# Patient Record
Sex: Female | Born: 1965 | Race: Black or African American | Hispanic: No | Marital: Married | Smoking: Never smoker
Health system: Southern US, Community
[De-identification: ages and names within clinical notes are randomized; demographics above are authoritative.]

## PROBLEM LIST (undated history)

## (undated) DIAGNOSIS — M549 Dorsalgia, unspecified: Secondary | ICD-10-CM

---

## 1997-05-15 ENCOUNTER — Inpatient Hospital Stay (HOSPITAL_COMMUNITY): Admission: AD | Admit: 1997-05-15 | Discharge: 1997-05-15 | Payer: Self-pay | Admitting: Obstetrics

## 1998-01-15 ENCOUNTER — Other Ambulatory Visit: Admission: RE | Admit: 1998-01-15 | Discharge: 1998-01-15 | Payer: Self-pay | Admitting: Obstetrics and Gynecology

## 1998-01-16 ENCOUNTER — Ambulatory Visit (HOSPITAL_COMMUNITY): Admission: RE | Admit: 1998-01-16 | Discharge: 1998-01-16 | Payer: Self-pay | Admitting: Obstetrics and Gynecology

## 1999-02-03 ENCOUNTER — Inpatient Hospital Stay (HOSPITAL_COMMUNITY): Admission: AD | Admit: 1999-02-03 | Discharge: 1999-02-03 | Payer: Self-pay | Admitting: Obstetrics and Gynecology

## 1999-04-25 ENCOUNTER — Inpatient Hospital Stay (HOSPITAL_COMMUNITY): Admission: AD | Admit: 1999-04-25 | Discharge: 1999-04-28 | Payer: Self-pay | Admitting: Obstetrics & Gynecology

## 1999-04-25 ENCOUNTER — Inpatient Hospital Stay (HOSPITAL_COMMUNITY): Admission: AD | Admit: 1999-04-25 | Discharge: 1999-04-25 | Payer: Self-pay | Admitting: Family Medicine

## 1999-05-29 ENCOUNTER — Other Ambulatory Visit: Admission: RE | Admit: 1999-05-29 | Discharge: 1999-05-29 | Payer: Self-pay | Admitting: Obstetrics & Gynecology

## 2006-01-28 DIAGNOSIS — L290 Pruritus ani: Secondary | ICD-10-CM

## 2006-01-28 DIAGNOSIS — J309 Allergic rhinitis, unspecified: Secondary | ICD-10-CM | POA: Insufficient documentation

## 2006-01-28 DIAGNOSIS — E669 Obesity, unspecified: Secondary | ICD-10-CM

## 2006-01-28 DIAGNOSIS — I10 Essential (primary) hypertension: Secondary | ICD-10-CM | POA: Insufficient documentation

## 2006-09-05 ENCOUNTER — Encounter (INDEPENDENT_AMBULATORY_CARE_PROVIDER_SITE_OTHER): Payer: Self-pay | Admitting: Family Medicine

## 2011-03-30 HISTORY — PX: BREAST BIOPSY: SHX20

## 2011-03-30 HISTORY — PX: BREAST EXCISIONAL BIOPSY: SUR124

## 2013-01-05 IMAGING — MG MAMMO DIGITAL SCREENING C
4 series · 4 of 4 positions shown · non-contrast
Comparison: none

REASON FOR EXAM: Left Side Mass Ordered WWO

Procedure: CT OF THE NECK:
HISTORY: LEFT sided mass.

[R CC]
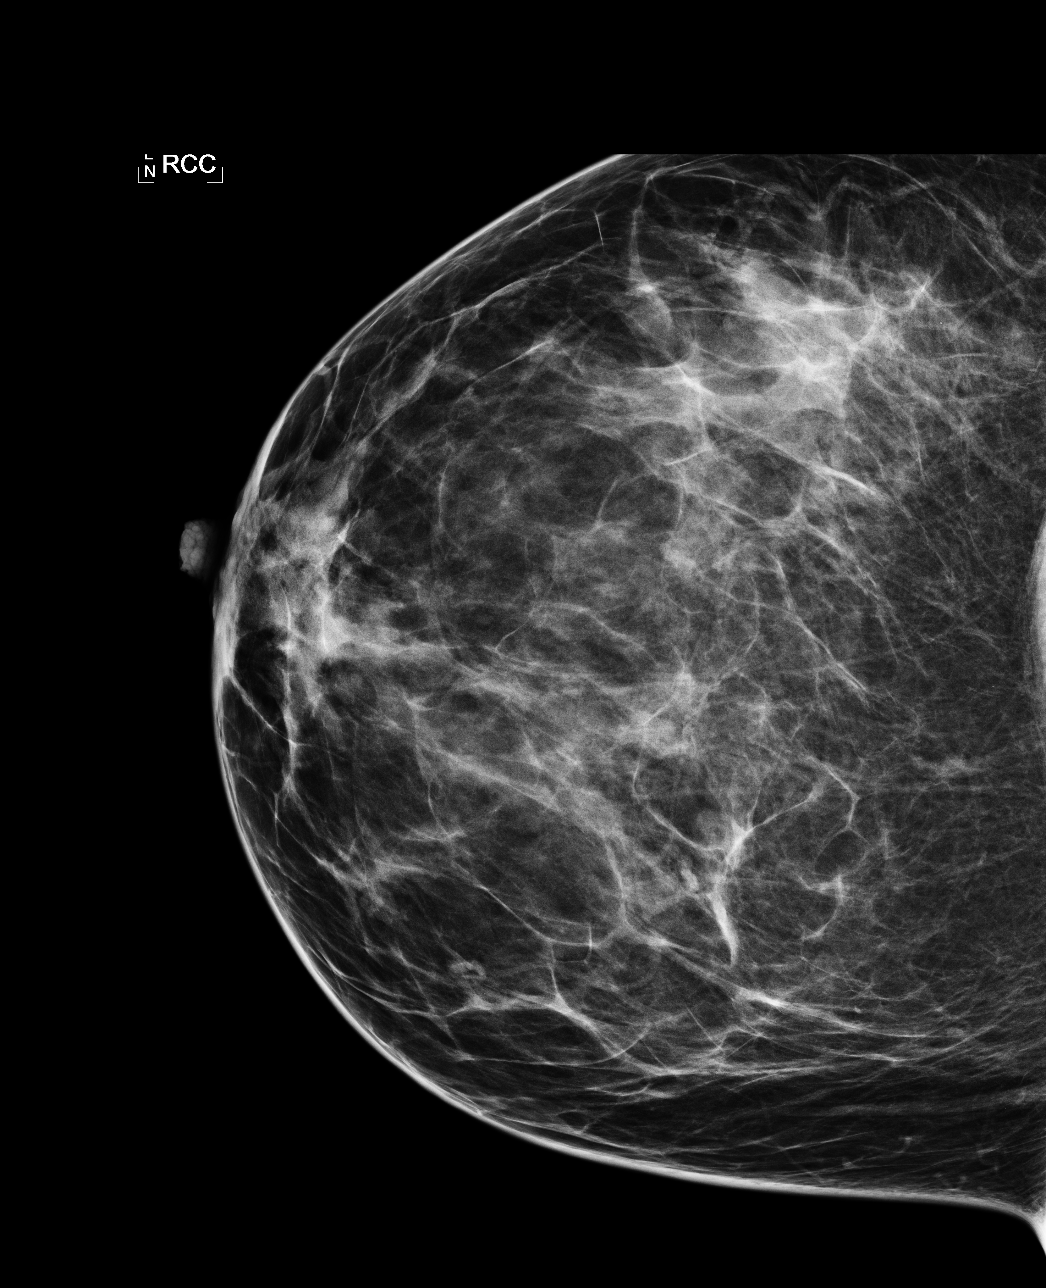

[L CC]
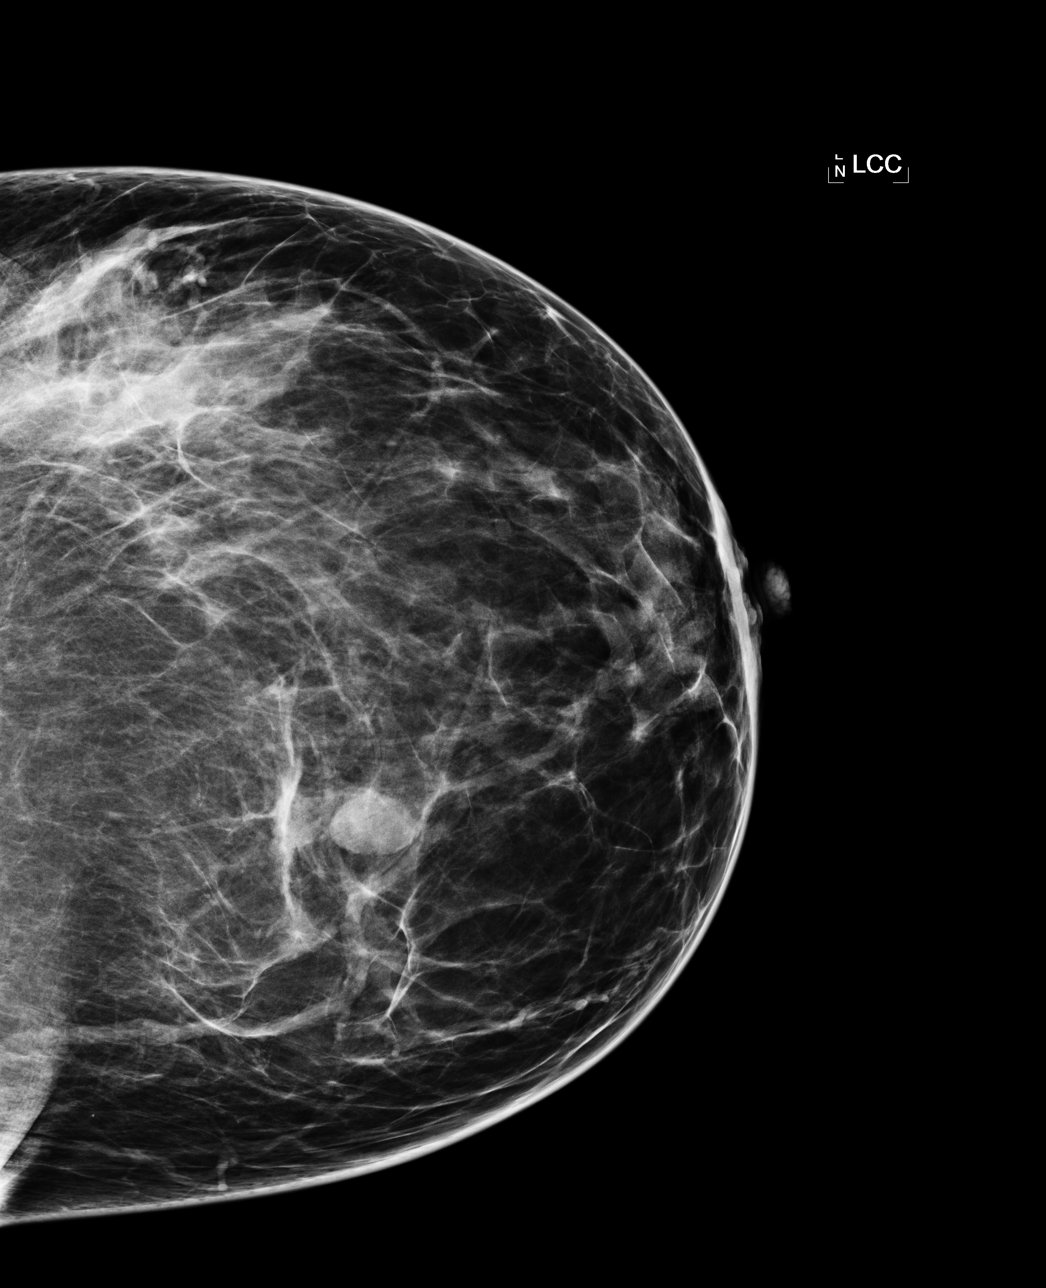

[R MLO]
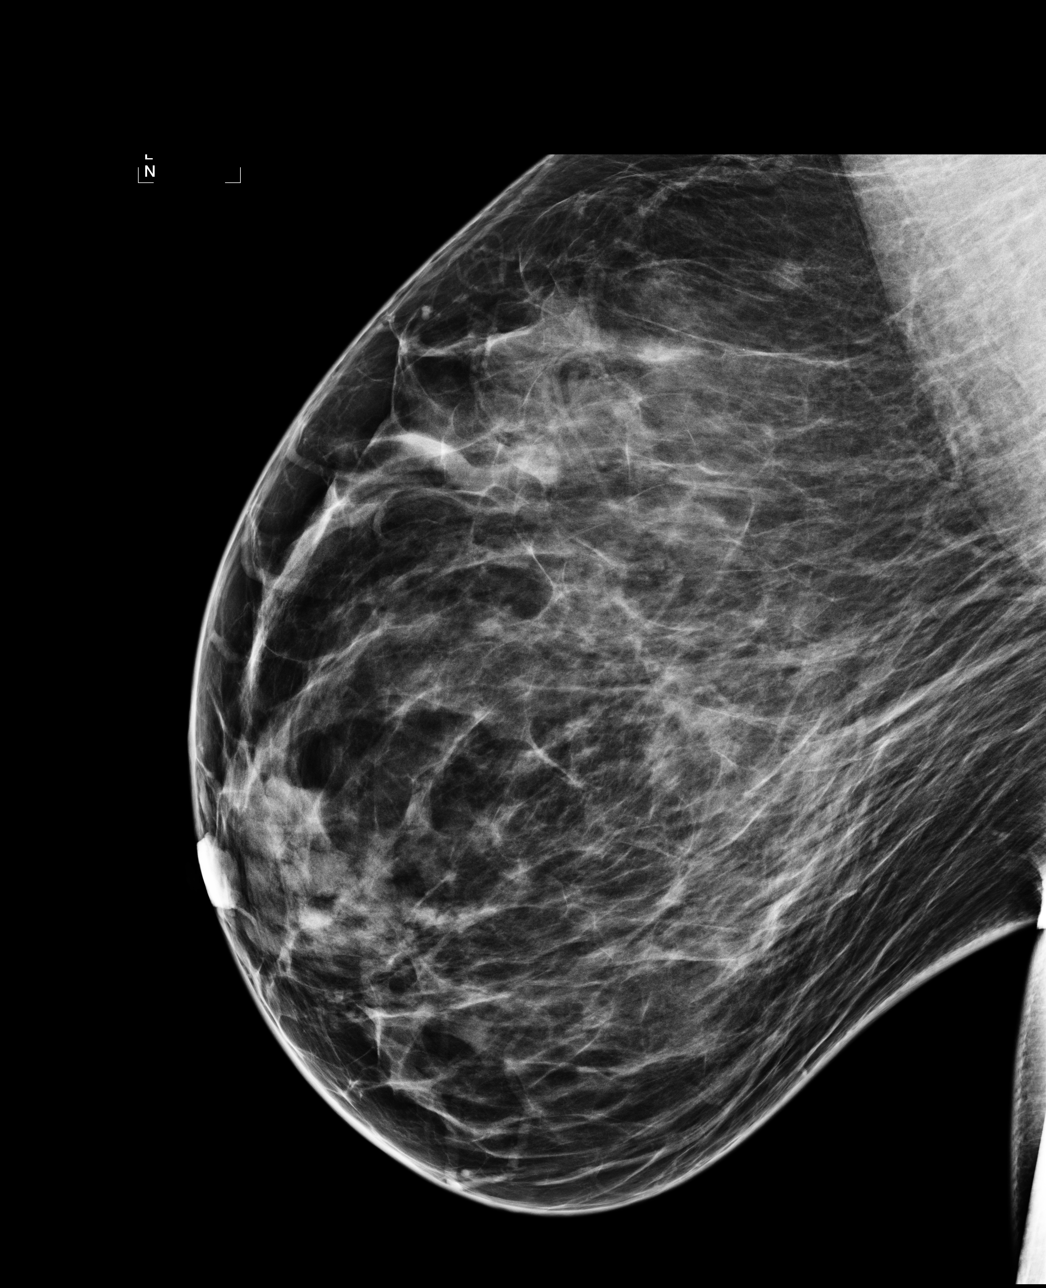

[L MLO]
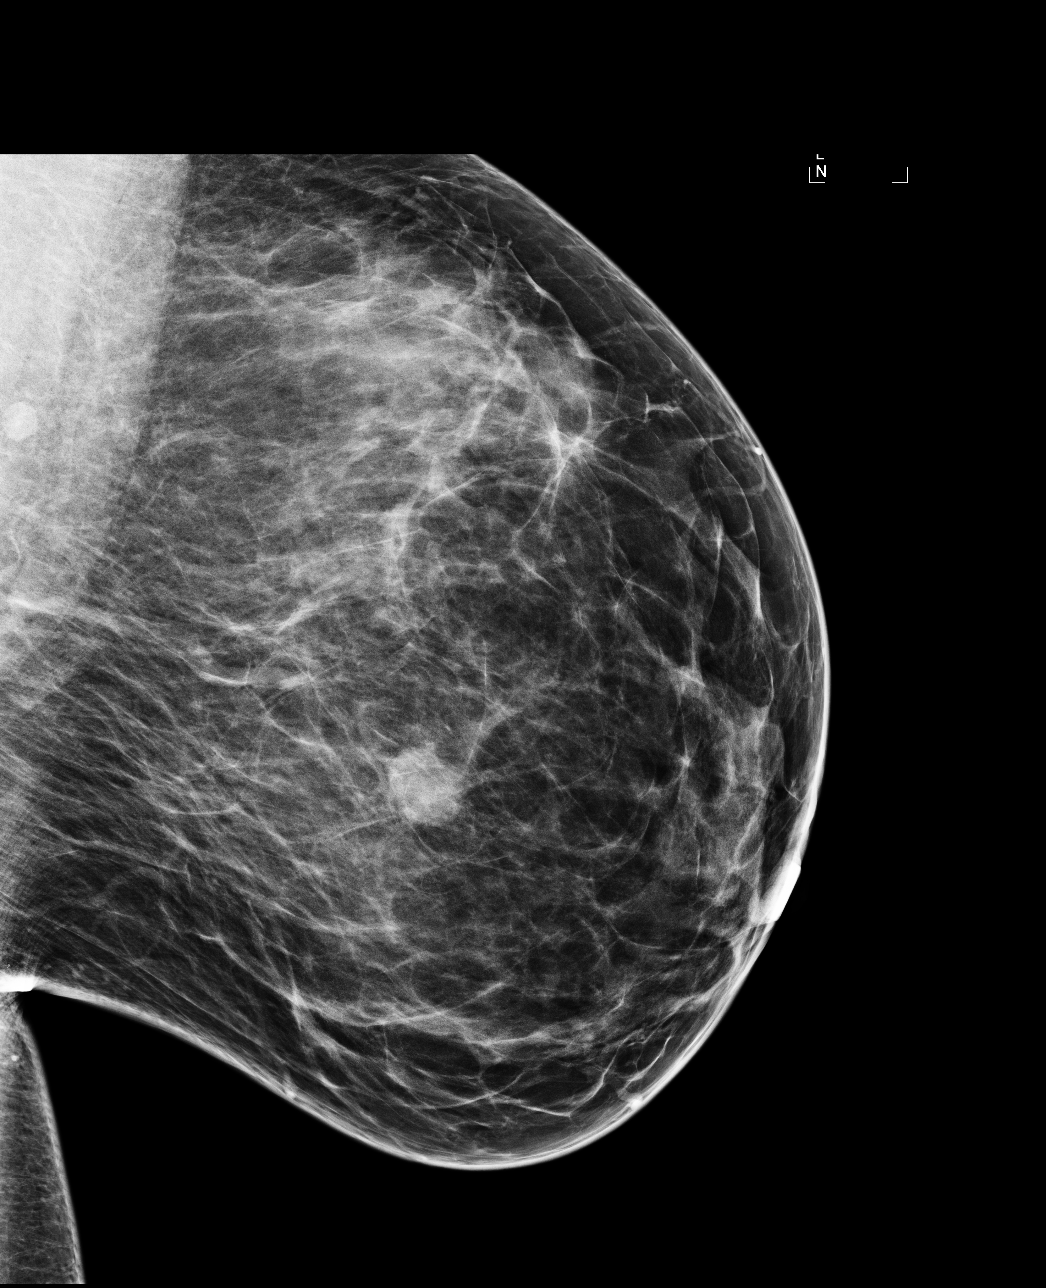

[4 of 4 positions shown; findings below may reference images not displayed]

FINDINGS: Standard IV contrast enhanced CT of the neck is obtained. The
paranasal sinuses are clear. The nasopharynx is clear. External auditory
canals are clear. Parotid and submandibular glands are clear. The tongue
base is normal. Shotty jugular lymph nodes are noted bilaterally. Mild
asymmetry is noted of the RIGHT aryepiglottic fold, direct visualization of
this is suggested. The vocal cords appeared normal. The upper trachea
appears normal. The thyroid appears normal. The superior mediastinum is
unremarkable. Mild pleural parenchymal thickening is noted in the lung
apices consistent with scarring.
IMPRESSION: 1)Mild prominence of the RIGHT aryepiglottic fold, direct visualization
with laryngoscopy is suggested for further evaluation to ensure this region
is normal.

 2)Shotty cervical lymph nodes. No mass lesion is identified.

## 2013-05-21 ENCOUNTER — Ambulatory Visit: Payer: Self-pay | Admitting: Physician Assistant

## 2013-05-21 LAB — RAPID INFLUENZA A&B ANTIGENS

## 2014-09-04 ENCOUNTER — Other Ambulatory Visit: Payer: Self-pay | Admitting: Family Medicine

## 2014-09-04 DIAGNOSIS — Z1231 Encounter for screening mammogram for malignant neoplasm of breast: Secondary | ICD-10-CM

## 2014-09-10 ENCOUNTER — Ambulatory Visit
Admission: RE | Admit: 2014-09-10 | Discharge: 2014-09-10 | Disposition: A | Discharge status: Home / Self Care | Payer: Managed Care, Other (non HMO) | Source: Ambulatory Visit | Attending: Family Medicine | Admitting: Family Medicine

## 2014-09-10 DIAGNOSIS — Z1231 Encounter for screening mammogram for malignant neoplasm of breast: Secondary | ICD-10-CM | POA: Diagnosis not present

## 2014-12-08 ENCOUNTER — Ambulatory Visit
Admission: EM | Admit: 2014-12-08 | Discharge: 2014-12-08 | Disposition: A | Discharge status: Home / Self Care | Payer: Managed Care, Other (non HMO) | Attending: Family Medicine | Admitting: Family Medicine

## 2014-12-08 DIAGNOSIS — Z8709 Personal history of other diseases of the respiratory system: Secondary | ICD-10-CM | POA: Diagnosis not present

## 2014-12-08 DIAGNOSIS — H6593 Unspecified nonsuppurative otitis media, bilateral: Secondary | ICD-10-CM | POA: Diagnosis not present

## 2014-12-08 DIAGNOSIS — J0100 Acute maxillary sinusitis, unspecified: Secondary | ICD-10-CM

## 2014-12-08 DIAGNOSIS — J209 Acute bronchitis, unspecified: Secondary | ICD-10-CM

## 2014-12-08 DIAGNOSIS — B349 Viral infection, unspecified: Secondary | ICD-10-CM

## 2014-12-08 HISTORY — DX: Dorsalgia, unspecified: M54.9

## 2014-12-08 MED ORDER — PREDNISONE 50 MG PO TABS: ORAL_TABLET | ORAL | Status: AC

## 2014-12-08 MED ORDER — SALINE SPRAY 0.65 % NA SOLN: 2.0000 | NASAL | Status: AC

## 2014-12-08 MED ORDER — FLUCONAZOLE 150 MG PO TABS: 150.0000 mg | ORAL_TABLET | Freq: Once | ORAL | Status: AC

## 2014-12-08 MED ORDER — ALBUTEROL SULFATE HFA 108 (90 BASE) MCG/ACT IN AERS: 1.0000 | INHALATION_SPRAY | RESPIRATORY_TRACT | Status: AC | PRN

## 2014-12-08 MED ORDER — PSEUDOEPHEDRINE HCL 30 MG PO TABS: 30.0000 mg | ORAL_TABLET | ORAL | Status: AC | PRN

## 2014-12-08 MED ORDER — AMOXICILLIN-POT CLAVULANATE 875-125 MG PO TABS: 1.0000 | ORAL_TABLET | Freq: Two times a day (BID) | ORAL | Status: AC

## 2014-12-08 MED ORDER — FLUTICASONE PROPIONATE 50 MCG/ACT NA SUSP: 1.0000 | Freq: Two times a day (BID) | NASAL | Status: AC

## 2014-12-08 MED ORDER — IPRATROPIUM-ALBUTEROL 0.5-2.5 (3) MG/3ML IN SOLN
3.0000 mL | Freq: Four times a day (QID) | RESPIRATORY_TRACT | Status: DC
  Administered 2014-12-08: 3 mL via RESPIRATORY_TRACT

## 2014-12-08 MED ORDER — ACETAMINOPHEN 500 MG PO TABS: 1000.0000 mg | ORAL_TABLET | Freq: Four times a day (QID) | ORAL | Status: AC | PRN

## 2014-12-08 NOTE — Discharge Instructions (Signed)
Acute Bronchitis Bronchitis is inflammation of the airways that extend from the windpipe into the lungs (bronchi). The inflammation often causes mucus to develop. This leads to a cough, which is the most common symptom of bronchitis.  In acute bronchitis, the condition usually develops suddenly and goes away over time, usually in a couple weeks. Smoking, allergies, and asthma can make bronchitis worse. Repeated episodes of bronchitis may cause further lung problems.  CAUSES Acute bronchitis is most often caused by the same virus that causes a cold. The virus can spread from person to person (contagious) through coughing, sneezing, and touching contaminated objects. SIGNS AND SYMPTOMS   Cough.   Fever.   Coughing up mucus.   Body aches.   Chest congestion.   Chills.   Shortness of breath.   Sore throat.  DIAGNOSIS  Acute bronchitis is usually diagnosed through a physical exam. Your health care provider will also ask you questions about your medical history. Tests, such as chest X-rays, are sometimes done to rule out other conditions.  TREATMENT  Acute bronchitis usually goes away in a couple weeks. Oftentimes, no medical treatment is necessary. Medicines are sometimes given for relief of fever or cough. Antibiotic medicines are usually not needed but may be prescribed in certain situations. In some cases, an inhaler may be recommended to help reduce shortness of breath and control the cough. A cool mist vaporizer may also be used to help thin bronchial secretions and make it easier to clear the chest.  HOME CARE INSTRUCTIONS  Get plenty of rest.   Drink enough fluids to keep your urine clear or pale yellow (unless you have a medical condition that requires fluid restriction). Increasing fluids may help thin your respiratory secretions (sputum) and reduce chest congestion, and it will prevent dehydration.   Take medicines only as directed by your health care provider.  If  you were prescribed an antibiotic medicine, finish it all even if you start to feel better.  Avoid smoking and secondhand smoke. Exposure to cigarette smoke or irritating chemicals will make bronchitis worse. If you are a smoker, consider using nicotine gum or skin patches to help control withdrawal symptoms. Quitting smoking will help your lungs heal faster.   Reduce the chances of another bout of acute bronchitis by washing your hands frequently, avoiding people with cold symptoms, and trying not to touch your hands to your mouth, nose, or eyes.   Keep all follow-up visits as directed by your health care provider.  SEEK MEDICAL CARE IF: Your symptoms do not improve after 1 week of treatment.  SEEK IMMEDIATE MEDICAL CARE IF:  You develop an increased fever or chills.   You have chest pain.   You have severe shortness of breath.  You have bloody sputum.   You develop dehydration.  You faint or repeatedly feel like you are going to pass out.  You develop repeated vomiting.  You develop a severe headache. MAKE SURE YOU:   Understand these instructions.  Will watch your condition.  Will get help right away if you are not doing well or get worse. Document Released: 04/22/2004 Document Revised: 07/30/2013 Document Reviewed: 09/05/2012 [facility] Patient Information 2015 [place], [place]. This information is not intended to replace advice given to you by your health care provider. Make sure you discuss any questions you have with your health care provider. Viral Infections A virus is a type of germ. Viruses can cause:  Minor sore throats.  Aches and pains.  Headaches.  Runny  nose.  Rashes.  Watery eyes.  Tiredness.  Coughs.  Loss of appetite.  Feeling sick to your stomach (nausea).  Throwing up (vomiting).  Watery poop (diarrhea). HOME CARE   Only take medicines as told by your doctor.  Drink enough water and fluids to keep your pee (urine) clear or pale  yellow. Sports drinks are a good choice.  Get plenty of rest and eat healthy. Soups and broths with crackers or rice are fine. GET HELP RIGHT AWAY IF:   You have a very bad headache.  You have shortness of breath.  You have chest pain or neck pain.  You have an unusual rash.  You cannot stop throwing up.  You have watery poop that does not stop.  You cannot keep fluids down.  You or your child has a temperature by mouth above 102 F (38.9 C), not controlled by medicine.  Your baby is older than 3 months with a rectal temperature of 102 F (38.9 C) or higher.  Your baby is 72 months old or younger with a rectal temperature of 100.4 F (38 C) or higher. MAKE SURE YOU:   Understand these instructions.  Will watch this condition.  Will get help right away if you are not doing well or get worse. Document Released: 02/26/2008 Document Revised: 06/07/2011 Document Reviewed: 07/21/2010 [facility] Patient Information 2015 [place], [place]. This information is not intended to replace advice given to you by your health care provider. Make sure you discuss any questions you have with your health care provider. Sinusitis Sinusitis is redness, soreness, and inflammation of the paranasal sinuses. Paranasal sinuses are air pockets within the bones of your face (beneath the eyes, the middle of the forehead, or above the eyes). In healthy paranasal sinuses, mucus is able to drain out, and air is able to circulate through them by way of your nose. However, when your paranasal sinuses are inflamed, mucus and air can become trapped. This can allow bacteria and other germs to grow and cause infection. Sinusitis can develop quickly and last only a short time (acute) or continue over a long period (chronic). Sinusitis that lasts for more than 12 weeks is considered chronic.  CAUSES  Causes of sinusitis include:  Allergies.  Structural abnormalities, such as displacement of the cartilage that  separates your nostrils (deviated septum), which can decrease the air flow through your nose and sinuses and affect sinus drainage.  Functional abnormalities, such as when the small hairs (cilia) that line your sinuses and help remove mucus do not work properly or are not present. SIGNS AND SYMPTOMS  Symptoms of acute and chronic sinusitis are the same. The primary symptoms are pain and pressure around the affected sinuses. Other symptoms include:  Upper toothache.  Earache.  Headache.  Bad breath.  Decreased sense of smell and taste.  A cough, which worsens when you are lying flat.  Fatigue.  Fever.  Thick drainage from your nose, which often is green and may contain pus (purulent).  Swelling and warmth over the affected sinuses. DIAGNOSIS  Your health care provider will perform a physical exam. During the exam, your health care provider may:  Look in your nose for signs of abnormal growths in your nostrils (nasal polyps).  Tap over the affected sinus to check for signs of infection.  View the inside of your sinuses (endoscopy) using an imaging device that has a light attached (endoscope). If your health care provider suspects that you have chronic sinusitis, one or more  of the following tests may be recommended:  Allergy tests.  Nasal culture. A sample of mucus is taken from your nose, sent to a lab, and screened for bacteria.  Nasal cytology. A sample of mucus is taken from your nose and examined by your health care provider to determine if your sinusitis is related to an allergy. TREATMENT  Most cases of acute sinusitis are related to a viral infection and will resolve on their own within 10 days. Sometimes medicines are prescribed to help relieve symptoms (pain medicine, decongestants, nasal steroid sprays, or saline sprays).  However, for sinusitis related to a bacterial infection, your health care provider will prescribe antibiotic medicines. These are medicines that  will help kill the bacteria causing the infection.  Rarely, sinusitis is caused by a fungal infection. In theses cases, your health care provider will prescribe antifungal medicine. For some cases of chronic sinusitis, surgery is needed. Generally, these are cases in which sinusitis recurs more than 3 times per year, despite other treatments. HOME CARE INSTRUCTIONS   Drink plenty of water. Water helps thin the mucus so your sinuses can drain more easily.  Use a humidifier.  Inhale steam 3 to 4 times a day (for example, sit in the bathroom with the shower running).  Apply a warm, moist washcloth to your face 3 to 4 times a day, or as directed by your health care provider.  Use saline nasal sprays to help moisten and clean your sinuses.  Take medicines only as directed by your health care provider.  If you were prescribed either an antibiotic or antifungal medicine, finish it all even if you start to feel better. SEEK IMMEDIATE MEDICAL CARE IF:  You have increasing pain or severe headaches.  You have nausea, vomiting, or drowsiness.  You have swelling around your face.  You have vision problems.  You have a stiff neck.  You have difficulty breathing. MAKE SURE YOU:   Understand these instructions.  Will watch your condition.  Will get help right away if you are not doing well or get worse. Document Released: 03/15/2005 Document Revised: 07/30/2013 Document Reviewed: 03/30/2011 [facility] Patient Information 2015 [place], [place]. This information is not intended to replace advice given to you by your health care provider. Make sure you discuss any questions you have with your health care provider. Otitis Media With Effusion Otitis media with effusion is the presence of fluid in the middle ear. This is a common problem in children, which often follows ear infections. It may be present for weeks or longer after the infection. Unlike an acute ear infection, otitis media with effusion  refers only to fluid behind the ear drum and not infection. Children with repeated ear and sinus infections and allergy problems are the most likely to get otitis media with effusion. CAUSES  The most frequent cause of the fluid buildup is dysfunction of the eustachian tubes. These are the tubes that drain fluid in the ears to the back of the nose (nasopharynx). SYMPTOMS   The main symptom of this condition is hearing loss. As a result, you or your child may:  Listen to the TV at a loud volume.  Not respond to questions.  Ask "what" often when spoken to.  Mistake or confuse one sound or word for another.  There may be a sensation of fullness or pressure but usually not pain. DIAGNOSIS   Your health care provider will diagnose this condition by examining you or your child's ears.  Your health  care provider may test the pressure in you or your child's ear with a tympanometer.  A hearing test may be conducted if the problem persists. TREATMENT   Treatment depends on the duration and the effects of the effusion.  Antibiotics, decongestants, nose drops, and cortisone-type drugs (tablets or nasal spray) may not be helpful.  Children with persistent ear effusions may have delayed language or behavioral problems. Children at risk for developmental delays in hearing, learning, and speech may require referral to a specialist earlier than children not at risk.  You or your child's health care provider may suggest a referral to an ear, nose, and throat surgeon for treatment. The following may help restore normal hearing:  Drainage of fluid.  Placement of ear tubes (tympanostomy tubes).  Removal of adenoids (adenoidectomy). HOME CARE INSTRUCTIONS   Avoid secondhand smoke.  Infants who are breastfed are less likely to have this condition.  Avoid feeding infants while they are lying flat.  Avoid known environmental allergens.  Avoid people who are sick. SEEK MEDICAL CARE IF:    Hearing is not better in 3 months.  Hearing is worse.  Ear pain.  Drainage from the ear.  Dizziness. MAKE SURE YOU:   Understand these instructions.  Will watch your condition.  Will get help right away if you are not doing well or get worse. Document Released: 04/22/2004 Document Revised: 07/30/2013 Document Reviewed: 10/10/2012 [facility] Patient Information 2015 [place], [place]. This information is not intended to replace advice given to you by your health care provider. Make sure you discuss any questions you have with your health care provider.

## 2014-12-08 NOTE — ED Provider Notes (Signed)
CSN: [id number]     Arrival date & time 12/08/14  1342 History   First MD Initiated Contact with Patient 12/08/14 1427     Chief Complaint  Patient presents with  . Shortness of Breath  . Fever   (Consider location/radiation/quality/duration/timing/severity/associated sxs/prior Treatment) HPI Comments: Single african [place] female with new onset dyspnea with exertion, wheezing last night when she went to bed, headache fever tmax 102 yesterday.  Another employee sick with similar symptoms at work [employer]  Has tried tylenol sinus max without any relief.  Hx asthma as child  Patient is a 49 y.o. female presenting with shortness of breath and fever. The history is provided by the patient.  Shortness of Breath Severity:  Mild Onset quality:  Sudden Duration:  2 days Timing:  Intermittent Progression:  Waxing and waning Chronicity:  New Context: activity and URI   Context: not animal exposure, not emotional upset, not fumes, not known allergens, not occupational exposure, not pollens, not smoke exposure, not strong odors and not weather changes   Relieved by:  Nothing Worsened by:  Activity Ineffective treatments:  Rest and position changes Associated symptoms: diaphoresis, fever, headaches, PND and wheezing   Associated symptoms: no abdominal pain, no chest pain, no claudication, no cough, no ear pain, no hemoptysis, no neck pain, no rash, no sore throat, no sputum production, no syncope, no swollen glands and no vomiting   Fever:    Duration:  2 days   Timing:  Intermittent   Max temp PTA (F):  102   Temp source:  Oral   Progression:  Waxing and waning Headaches:    Severity:  Moderate   Onset quality:  Sudden   Duration:  1 day   Progression:  Resolved   Chronicity:  New Wheezing:    Severity:  Mild   Onset quality:  Sudden   Duration:  1 day   Timing:  Intermittent   Progression:  Resolved   Chronicity:  New Risk factors: obesity   Risk factors: no recent alcohol  use, no family hx of DVT, no hx of cancer, no hx of PE/DVT, no oral contraceptive use, no prolonged immobilization, no recent surgery and no tobacco use   Fever Associated symptoms: chills, congestion, headaches, myalgias, nausea and rhinorrhea   Associated symptoms: no chest pain, no confusion, no cough, no diarrhea, no dysuria, no ear pain, no rash, no sore throat and no vomiting     Past Medical History  Diagnosis Date  . Back pain    Past Surgical History  Procedure Laterality Date  . Breast biopsy Left 2013    neg   History reviewed. No pertinent family history. Social History  Substance Use Topics  . Smoking status: Never Smoker   . Smokeless tobacco: None  . Alcohol Use: Yes   OB History    No data available     Review of Systems  Constitutional: Positive for fever, chills, diaphoresis, appetite change and fatigue. Negative for activity change.  HENT: Positive for congestion, postnasal drip, rhinorrhea, sinus pressure and sneezing. Negative for dental problem, drooling, ear discharge, ear pain, facial swelling, hearing loss, mouth sores, nosebleeds, sore throat, tinnitus, trouble swallowing and voice change.   Eyes: Negative for photophobia, pain, discharge, redness, itching and visual disturbance.  Respiratory: Positive for chest tightness, shortness of breath and wheezing. Negative for cough, hemoptysis, sputum production, choking and stridor.   Cardiovascular: Positive for PND. Negative for chest pain, palpitations, claudication, leg swelling and syncope.  Gastrointestinal: Positive for nausea. Negative for vomiting, abdominal pain, diarrhea, constipation, abdominal distention and rectal pain.  Endocrine: Negative for cold intolerance and heat intolerance.  Genitourinary: Negative for dysuria.  Musculoskeletal: Positive for myalgias. Negative for back pain, joint swelling, arthralgias, gait problem, neck pain and neck stiffness.  Skin: Negative for color change, pallor,  rash and wound.  Allergic/Immunologic: Negative for environmental allergies and food allergies.  Neurological: Positive for headaches. Negative for dizziness, tremors, seizures, syncope, facial asymmetry, speech difficulty, weakness, light-headedness and numbness.  Hematological: Negative for adenopathy. Does not bruise/bleed easily.  Psychiatric/Behavioral: Negative for behavioral problems, confusion, sleep disturbance and agitation.    Allergies  Cashew nut oil  Home Medications   Prior to Admission medications   Medication Sig Start Date End Date Taking? Authorizing Provider  baclofen (LIORESAL) 10 MG tablet Take 10 mg by mouth 3 (three) times daily.   Yes Historical Provider, MD  acetaminophen (TYLENOL) 500 MG tablet Take 2 tablets (1,000 mg total) by mouth every 6 (six) hours as needed for moderate pain or fever. 12/08/14   Barbaraann Barthel, NP  albuterol (PROVENTIL HFA;VENTOLIN HFA) 108 (90 BASE) MCG/ACT inhaler Inhale 1-2 puffs into the lungs every 4 (four) hours as needed for wheezing or shortness of breath. 12/08/14   Barbaraann Barthel, NP  amoxicillin-clavulanate (AUGMENTIN) 875-125 MG per tablet Take 1 tablet by mouth every 12 (twelve) hours. 12/08/14   Barbaraann Barthel, NP  fluconazole (DIFLUCAN) 150 MG tablet Take 1 tablet (150 mg total) by mouth once. 12/08/14 12/15/14  Barbaraann Barthel, NP  fluticasone (FLONASE) 50 MCG/ACT nasal spray Place 1 spray into both nostrils 2 (two) times daily. 12/08/14   Barbaraann Barthel, NP  predniSONE (DELTASONE) 50 MG tablet Take one tab po daily 12/08/14   Barbaraann Barthel, NP  pseudoephedrine (SUDAFED) 30 MG tablet Take 1 tablet (30 mg total) by mouth every 4 (four) hours as needed for congestion. Max 8 tabs per 24 hours 12/08/14   Barbaraann Barthel, NP  sodium chloride (OCEAN) 0.65 % SOLN nasal spray Place 2 sprays into both nostrils every 2 (two) hours while awake. 12/08/14   Barbaraann Barthel, NP   Meds Ordered and Administered this Visit    Medications - No data to display  BP 147/79 mmHg  Pulse 70  Temp(Src) 97.5 F (36.4 C) (Oral)  Resp 18  SpO2 99%  LMP 12/02/2014 No data found.   Physical Exam  Constitutional: She is oriented to person, place, and time. Vital signs are normal. She appears well-developed and well-nourished. No distress.  HENT:  Head: Normocephalic and atraumatic.  Right Ear: Hearing, external ear and ear canal normal. A middle ear effusion is present.  Left Ear: Hearing, tympanic membrane, external ear and ear canal normal.  Nose: Mucosal edema and rhinorrhea present. No nose lacerations, sinus tenderness, nasal deformity, septal deviation or nasal septal hematoma. No epistaxis.  No foreign bodies. Right sinus exhibits maxillary sinus tenderness. Right sinus exhibits no frontal sinus tenderness. Left sinus exhibits maxillary sinus tenderness. Left sinus exhibits no frontal sinus tenderness.  Mouth/Throat: Uvula is midline and mucous membranes are normal. Mucous membranes are not pale, not dry and not cyanotic. She does not have dentures. No oral lesions. No trismus in the jaw. Normal dentition. No dental abscesses, uvula swelling, lacerations or dental caries. Posterior oropharyngeal edema and posterior oropharyngeal erythema present. No oropharyngeal exudate or tonsillar abscesses.  Bilateral cryptic tonsils enlarged 3+/4; cobblestoning posterior pharynx; bilateral TMs with air  fluid level; bilateral nasal turbinates with edema/erythema clear discharge  Eyes: Conjunctivae, EOM and lids are normal. Pupils are equal, round, and reactive to light. Right eye exhibits no discharge. Left eye exhibits no discharge. No scleral icterus.  Neck: Trachea normal and normal range of motion. Neck supple. No tracheal deviation present.  Cardiovascular: Normal rate, regular rhythm, normal heart sounds and intact distal pulses.  Exam reveals no gallop and no friction rub.   No murmur heard. Pulmonary/Chest: Effort  normal. No accessory muscle usage or stridor. No respiratory distress. She has decreased breath sounds. She has no wheezes. She has no rhonchi. She has no rales. She exhibits no tenderness.  Coarse breath sounds upper airway prior to nebulizer and decreased lower lobes; after nebulizer treatment improved airflow and patient reported chest tightness resolved  Abdominal: Soft. She exhibits no shifting dullness, no distension, no pulsatile liver, no fluid wave, no abdominal bruit, no ascites, no pulsatile midline mass and no mass. Bowel sounds are decreased. There is no hepatosplenomegaly. There is no tenderness. There is no rigidity, no rebound, no guarding, no CVA tenderness, no tenderness at McBurney's point and negative Murphy's sign. Hernia confirmed negative in the ventral area.  Dull to percussion x 4 quads  Musculoskeletal: Normal range of motion. She exhibits no edema or tenderness.  Lymphadenopathy:    She has no cervical adenopathy.  Neurological: She is alert and oriented to person, place, and time. She exhibits normal muscle tone. Coordination normal.  Skin: Skin is warm, dry and intact. No rash noted. She is not diaphoretic. No erythema. No pallor.  Psychiatric: She has a normal mood and affect. Her speech is normal and behavior is normal. Judgment and thought content normal. Cognition and memory are normal.  Nursing note and vitals reviewed.   ED Course  Procedures (including critical care time)  Labs Review Labs Reviewed - No data to display  Imaging Review No results found.   Medications  ipratropium-albuterol (DUONEB) 0.5-2.5 (3) MG/3ML nebulizer solution 3 mL (3 mLs Nebulization Given 12/08/14 1438)  by RN Thayer Ohm Paterno  1500 Re-evaluated patient BBS CTA. sp02 100% room air Chest tightness resolved.  Work excuse for tomorrow.  Demonstrated MDI use.  Start prednisone today due to history of asthma as a child.  Hold Rx for augmentin if flonase doesn't resolve sinus  congestion/rhinitis within 72 hours and still having fever start augmentin. Patient reported yeast infections with amoxicillin prefers diflucan 1 tab prn po.   Albuterol MDI 1-2 puffs po prn q4-6h next dose in 4 hours as nebulizer in clinic.  Patient verbalized understanding of information/instructions, agreed with plan of care and had no further questions at this time.  MDM   1. Acute bronchitis, unspecified organism   2. Otitis media with effusion, bilateral   3. Acute maxillary sinusitis, recurrence not specified   4. History of asthma   5. Viral illness     Prednisone  po daily x 5 days as history of asthma as child.  Bronchitis simple, community acquired, may have started as viral (probably respiratory syncytial, parainfluenza, influenza, or adenovirus), but now evidence of acute purulent bronchitis with resultant bronchial edema and mucus formation.  Viruses are the most common cause of bronchial inflammation in otherwise healthy adults with acute bronchitis.  The appearance of sputum is not predictive of whether a bacterial infection is present.  Purulent sputum is most often caused by viral infections.  There are a small portion of those caused by non-viral agents being Mycoplamsa  pneumonia.  Microscopic examination or C&S of sputum in the healthy adult with acute bronchitis is generally not helpful (usually negative or normal respiratory flora) other considerations being cough from upper respiratory tract infections, sinusitis or allergic syndromes (mild asthma or viral pneumonia).  Differential Diagnosis:  reactive airway disease (asthma, allergic aspergillosis (eosinophilia), chronic bronchitis, respiratory infection (Sinusitis, Common cold, pneumonia), congestive heart failure, reflux esophagitis, bronchogenic tumor, aspiration syndromes and/or exposure irritants/tobacco smoke.  In this case, there is no evidence of any invasive bacterial illness.  Most likely viral etiology so will hold on  antibiotic treatment.  Advise supportive care with rest, encourage fluids, good hygiene and watch for any worsening symptoms.  If they were to develop:  come back to the office or go to the emergency room if after hours. Without high fever, severe dyspnea, lack of physical findings or other risk factors, I will hold on a chest radiograph and CBC at this time. I discussed that approximately 50% of patients with acute bronchitis have a cough that lasts up to three weeks, and 25% for over a month.  Tylenol, one to two tablets every four hours as needed for fever or myalgias.   No aspirin.  Patient instructed to follow up in one week or sooner if symptoms worsen. Patient verbalized agreement and understanding of treatment plan.  P2:  hand washing and cover cough  Supportive treatment.   No evidence of invasive bacterial infection, non toxic and well hydrated.  This is most likely self limiting viral infection.  I do not see where any further testing or imaging is necessary at this time.   I will suggest supportive care, rest, good hygiene and encourage the patient to take adequate fluids.  The patient is to return to clinic or EMERGENCY ROOM if symptoms worsen or change significantly e.g. ear pain, fever, purulent discharge from ears or bleeding.  Exitcare handout on otitis media with effusion given to patient.  Patient verbalized agreement and understanding of treatment plan.    Suspect Viral illness: no evidence of invasive bacterial infection, non toxic and well hydrated.  This is most likely self limiting viral infection.  I do not see where any further testing or imaging is necessary at this time.   I will suggest supportive care, rest, good hygiene and encourage the patient to take adequate fluids. Sudafed 30mg  po q4-6h prn; flonase 1 spray each nostril BID prn, nasal saline 1-2 sprays each nostril prn q2h, motrin 800mg  po TID prn.  Discussed honey with lemon and salt water gargles for comfort also.  The  patient is to return to clinic or EMERGENCY ROOM if symptoms worsen or change significantly e.g. fever, lethargy, SOB, wheezing.  Exitcare handout on viral illness given to patient.  Patient verbalized agreement and understanding of treatment plan.    Augmentin 875mg  po BID.  Flonase 1 spray each nostril BID.  No evidence of systemic bacterial infection, non toxic and well hydrated.  I do not see where any further testing or imaging is necessary at this time.   I will suggest supportive care, rest, good hygiene and encourage the patient to take adequate fluids.  The patient is to return to clinic or EMERGENCY ROOM if symptoms worsen or change significantly.  Exitcare handout on sinusitis given to patient.  Patient verbalized agreement and understanding of treatment plan and had no further questions at this time.   P2:  Hand washing and cover cough  Barbaraann Barthel, NP 12/09/14 2152

## 2014-12-08 NOTE — ED Notes (Signed)
Patient with complaint of not feeling well started yesterday. Fever. Short of breath, pressure in chest hard to take a deep breath. Feels tired. Denies any chest pain at this time.

## 2015-11-04 ENCOUNTER — Other Ambulatory Visit: Payer: Self-pay | Admitting: Family Medicine

## 2015-11-04 DIAGNOSIS — Z1231 Encounter for screening mammogram for malignant neoplasm of breast: Secondary | ICD-10-CM

## 2015-11-12 ENCOUNTER — Other Ambulatory Visit: Payer: Self-pay | Admitting: Family Medicine

## 2015-11-12 ENCOUNTER — Ambulatory Visit
Admission: RE | Admit: 2015-11-12 | Discharge: 2015-11-12 | Disposition: A | Discharge status: Home / Self Care | Payer: Managed Care, Other (non HMO) | Source: Ambulatory Visit | Attending: Family Medicine | Admitting: Family Medicine

## 2015-11-12 DIAGNOSIS — Z1231 Encounter for screening mammogram for malignant neoplasm of breast: Secondary | ICD-10-CM

## 2016-10-14 ENCOUNTER — Other Ambulatory Visit: Payer: Self-pay | Admitting: Family Medicine

## 2016-10-14 DIAGNOSIS — Z1231 Encounter for screening mammogram for malignant neoplasm of breast: Secondary | ICD-10-CM

## 2016-11-15 ENCOUNTER — Ambulatory Visit
Admission: RE | Admit: 2016-11-15 | Discharge: 2016-11-15 | Disposition: A | Discharge status: Home / Self Care | Payer: Managed Care, Other (non HMO) | Source: Ambulatory Visit | Attending: Family Medicine | Admitting: Family Medicine

## 2016-11-15 DIAGNOSIS — Z1231 Encounter for screening mammogram for malignant neoplasm of breast: Secondary | ICD-10-CM | POA: Diagnosis not present

## 2018-01-23 ENCOUNTER — Other Ambulatory Visit: Payer: Self-pay | Admitting: Family Medicine

## 2018-01-23 DIAGNOSIS — Z1231 Encounter for screening mammogram for malignant neoplasm of breast: Secondary | ICD-10-CM

## 2018-02-02 ENCOUNTER — Ambulatory Visit
Admission: RE | Admit: 2018-02-02 | Discharge: 2018-02-02 | Disposition: A | Discharge status: Home / Self Care | Payer: Managed Care, Other (non HMO) | Source: Ambulatory Visit | Attending: Family Medicine | Admitting: Family Medicine

## 2018-02-02 ENCOUNTER — Encounter (INDEPENDENT_AMBULATORY_CARE_PROVIDER_SITE_OTHER): Payer: Self-pay

## 2018-02-02 DIAGNOSIS — Z1231 Encounter for screening mammogram for malignant neoplasm of breast: Secondary | ICD-10-CM | POA: Diagnosis not present

## 2019-03-28 ENCOUNTER — Other Ambulatory Visit: Payer: Self-pay | Admitting: Family Medicine

## 2019-10-31 ENCOUNTER — Other Ambulatory Visit: Payer: Self-pay | Admitting: Family Medicine

## 2019-10-31 DIAGNOSIS — Z1231 Encounter for screening mammogram for malignant neoplasm of breast: Secondary | ICD-10-CM

## 2019-11-14 ENCOUNTER — Ambulatory Visit
Admission: RE | Admit: 2019-11-14 | Discharge: 2019-11-14 | Disposition: A | Discharge status: Home / Self Care | Payer: Managed Care, Other (non HMO) | Source: Ambulatory Visit | Attending: Family Medicine | Admitting: Family Medicine

## 2019-11-14 ENCOUNTER — Other Ambulatory Visit: Payer: Self-pay

## 2019-11-14 DIAGNOSIS — Z1231 Encounter for screening mammogram for malignant neoplasm of breast: Secondary | ICD-10-CM | POA: Diagnosis present

## 2020-10-28 ENCOUNTER — Other Ambulatory Visit: Payer: Self-pay | Admitting: Family Medicine

## 2020-10-28 DIAGNOSIS — Z1231 Encounter for screening mammogram for malignant neoplasm of breast: Secondary | ICD-10-CM

## 2020-11-17 ENCOUNTER — Ambulatory Visit
Admission: RE | Admit: 2020-11-17 | Discharge: 2020-11-17 | Disposition: A | Discharge status: Home / Self Care | Payer: Managed Care, Other (non HMO) | Source: Ambulatory Visit | Attending: Family Medicine | Admitting: Family Medicine

## 2020-11-17 ENCOUNTER — Other Ambulatory Visit: Payer: Self-pay

## 2020-11-17 DIAGNOSIS — Z1231 Encounter for screening mammogram for malignant neoplasm of breast: Secondary | ICD-10-CM | POA: Diagnosis not present

## 2021-10-21 ENCOUNTER — Other Ambulatory Visit: Payer: Self-pay | Admitting: Family Medicine

## 2021-10-21 DIAGNOSIS — Z1231 Encounter for screening mammogram for malignant neoplasm of breast: Secondary | ICD-10-CM

## 2021-11-26 ENCOUNTER — Ambulatory Visit
Admission: RE | Admit: 2021-11-26 | Discharge: 2021-11-26 | Disposition: A | Discharge status: Home / Self Care | Payer: Managed Care, Other (non HMO) | Source: Ambulatory Visit | Attending: Family Medicine | Admitting: Family Medicine

## 2021-11-26 DIAGNOSIS — Z1231 Encounter for screening mammogram for malignant neoplasm of breast: Secondary | ICD-10-CM | POA: Insufficient documentation

## 2022-11-01 ENCOUNTER — Other Ambulatory Visit: Payer: Self-pay | Admitting: Family Medicine

## 2022-11-01 DIAGNOSIS — Z1231 Encounter for screening mammogram for malignant neoplasm of breast: Secondary | ICD-10-CM

## 2022-11-09 ENCOUNTER — Other Ambulatory Visit: Payer: Self-pay | Admitting: Orthopedic Surgery

## 2022-11-09 DIAGNOSIS — M5416 Radiculopathy, lumbar region: Secondary | ICD-10-CM

## 2022-11-09 DIAGNOSIS — M5136 Other intervertebral disc degeneration, lumbar region: Secondary | ICD-10-CM

## 2022-11-23 ENCOUNTER — Encounter: Payer: Self-pay | Admitting: Orthopedic Surgery

## 2022-11-26 ENCOUNTER — Ambulatory Visit
Admission: RE | Admit: 2022-11-26 | Discharge: 2022-11-26 | Disposition: A | Discharge status: Home / Self Care | Payer: Managed Care, Other (non HMO) | Source: Ambulatory Visit | Attending: Orthopedic Surgery | Admitting: Orthopedic Surgery

## 2022-11-26 DIAGNOSIS — M5416 Radiculopathy, lumbar region: Secondary | ICD-10-CM

## 2022-11-26 DIAGNOSIS — M5136 Other intervertebral disc degeneration, lumbar region: Secondary | ICD-10-CM

## 2022-11-30 ENCOUNTER — Ambulatory Visit
Admission: RE | Admit: 2022-11-30 | Discharge: 2022-11-30 | Disposition: A | Discharge status: Home / Self Care | Payer: Managed Care, Other (non HMO) | Source: Ambulatory Visit | Attending: Family Medicine | Admitting: Family Medicine

## 2022-11-30 DIAGNOSIS — Z1231 Encounter for screening mammogram for malignant neoplasm of breast: Secondary | ICD-10-CM | POA: Insufficient documentation

## 2023-11-23 ENCOUNTER — Other Ambulatory Visit: Payer: Self-pay | Admitting: Family Medicine

## 2023-11-23 DIAGNOSIS — Z1231 Encounter for screening mammogram for malignant neoplasm of breast: Secondary | ICD-10-CM

## 2023-12-26 ENCOUNTER — Ambulatory Visit
Admission: RE | Admit: 2023-12-26 | Discharge: 2023-12-26 | Disposition: A | Discharge status: Home / Self Care | Source: Ambulatory Visit | Attending: Family Medicine | Admitting: Family Medicine

## 2023-12-26 DIAGNOSIS — Z1231 Encounter for screening mammogram for malignant neoplasm of breast: Secondary | ICD-10-CM | POA: Diagnosis present

## 2024-03-20 ENCOUNTER — Encounter: Payer: Self-pay | Admitting: Internal Medicine

## 2024-03-26 NOTE — Anesthesia Preprocedure Evaluation (Signed)
"                                    Anesthesia Evaluation  Patient identified by MRN, date of birth, ID band Patient awake    Reviewed: Allergy & Precautions, H&P , NPO status , Patient's Chart, lab work & pertinent test results  Airway Mallampati: II  TM Distance: >3 FB Neck ROM: full    Dental no notable dental hx.    Pulmonary neg pulmonary ROS   Pulmonary exam normal        Cardiovascular hypertension, Normal cardiovascular exam     Neuro/Psych negative neurological ROS  negative psych ROS   GI/Hepatic negative GI ROS, Neg liver ROS,,,  Endo/Other  negative endocrine ROS    Renal/GU negative Renal ROS  negative genitourinary   Musculoskeletal   Abdominal   Peds  Hematology negative hematology ROS (+)   Anesthesia Other Findings Past Medical History: No date: Back pain  Past Surgical History: 2013: BREAST BIOPSY; Left     Comment:  neg 2013: BREAST EXCISIONAL BIOPSY; Left     Reproductive/Obstetrics negative OB ROS                              Anesthesia Physical Anesthesia Plan  ASA: 2  Anesthesia Plan: General   Post-op Pain Management:    Induction: Intravenous  PONV Risk Score and Plan: Propofol  infusion and TIVA  Airway Management Planned: Natural Airway  Additional Equipment:   Intra-op Plan:   Post-operative Plan:   Informed Consent:      Dental Advisory Given  Plan Discussed with: CRNA and Surgeon  Anesthesia Plan Comments:          Anesthesia Quick Evaluation  "

## 2024-03-27 ENCOUNTER — Other Ambulatory Visit: Payer: Self-pay

## 2024-03-27 ENCOUNTER — Ambulatory Visit
Admission: RE | Admit: 2024-03-27 | Discharge: 2024-03-27 | Disposition: A | Discharge status: Home / Self Care | Attending: Internal Medicine | Admitting: Internal Medicine

## 2024-03-27 ENCOUNTER — Encounter
Admission: RE | Disposition: A | Discharge status: Home / Self Care | Payer: Self-pay | Source: Home / Self Care | Attending: Internal Medicine

## 2024-03-27 ENCOUNTER — Ambulatory Visit: Payer: Self-pay

## 2024-03-27 ENCOUNTER — Encounter: Payer: Self-pay | Admitting: Internal Medicine

## 2024-03-27 DIAGNOSIS — K5730 Diverticulosis of large intestine without perforation or abscess without bleeding: Secondary | ICD-10-CM | POA: Diagnosis not present

## 2024-03-27 DIAGNOSIS — K640 First degree hemorrhoids: Secondary | ICD-10-CM | POA: Diagnosis not present

## 2024-03-27 DIAGNOSIS — Z1211 Encounter for screening for malignant neoplasm of colon: Secondary | ICD-10-CM | POA: Diagnosis present

## 2024-03-27 DIAGNOSIS — K621 Rectal polyp: Secondary | ICD-10-CM | POA: Insufficient documentation

## 2024-03-27 HISTORY — PX: POLYPECTOMY: SHX149

## 2024-03-27 HISTORY — PX: COLONOSCOPY: SHX5424

## 2024-03-27 SURGERY — COLONOSCOPY
Anesthesia: General

## 2024-03-27 MED ORDER — PROPOFOL 500 MG/50ML IV EMUL
INTRAVENOUS | Status: DC | PRN
  Administered 2024-03-27: 50 mg via INTRAVENOUS
  Administered 2024-03-27: 150 ug/kg/min via INTRAVENOUS

## 2024-03-27 MED ORDER — SODIUM CHLORIDE 0.9 % IV SOLN: INTRAVENOUS | Status: DC

## 2024-03-27 NOTE — Op Note (Signed)
 [facility] Gastroenterology Patient Name: Paige Bell Procedure Date: 03/27/2024 9:26 AM MRN: [id number] Account #: [id number] Date of Birth: [birth year] Admit Type: Outpatient Age: 58 Room: [facility] ENDO ROOM 3 Gender: Female Note Status: Finalized Instrument Name: Colon Scope [id number] Procedure:             Colonoscopy Indications:           Screening for colorectal malignant neoplasm Providers:             Jeyson Deshotel K. Aundria MD, MD Referring MD:          George Sionne (Referring MD) Medicines:             Propofol  per Anesthesia Complications:         No immediate complications. Estimated blood loss:                         Minimal. Procedure:             Pre-Anesthesia Assessment:                        - The risks and benefits of the procedure and the                         sedation options and risks were discussed with the                         patient. All questions were answered and informed                         consent was obtained.                        - Patient identification and proposed procedure were                         verified prior to the procedure by the nurse. The                         procedure was verified in the procedure room.                        - ASA Grade Assessment: II - A patient with mild                         systemic disease.                        - After reviewing the risks and benefits, the patient                         was deemed in satisfactory condition to undergo the                         procedure.                        After obtaining informed consent, the colonoscope was                         passed under direct vision.  Throughout the procedure,                         the patient's blood pressure, pulse, and oxygen                         saturations were monitored continuously. The                         Colonoscope was introduced through the anus and                         advanced to the  the cecum, identified by appendiceal                         orifice and ileocecal valve. The colonoscopy was                         performed without difficulty. The patient tolerated                         the procedure well. The quality of the bowel                         preparation was good. The ileocecal valve, appendiceal                         orifice, and rectum were photographed. Findings:      The perianal and digital rectal examinations were normal. Pertinent       negatives include normal sphincter tone and no palpable rectal lesions.      Many large-mouthed and small-mouthed diverticula were found in the       sigmoid colon. There was no evidence of diverticular bleeding.      A 2 mm polyp was found in the rectum. The polyp was sessile. The polyp       was removed with a cold biopsy forceps. Resection and retrieval were       complete. Estimated blood loss was minimal.      Non-bleeding internal hemorrhoids were found during retroflexion. The       hemorrhoids were Grade I (internal hemorrhoids that do not prolapse).      The exam was otherwise without abnormality. Impression:            - Mild diverticulosis in the sigmoid colon. There was                         no evidence of diverticular bleeding.                        - One 2 mm polyp in the rectum, removed with a cold                         biopsy forceps. Resected and retrieved.                        - Non-bleeding internal hemorrhoids.                        - The examination was otherwise normal. Recommendation:        -  Patient has a contact number available for                         emergencies. The signs and symptoms of potential                         delayed complications were discussed with the patient.                         Return to normal activities tomorrow. Written                         discharge instructions were provided to the patient.                        - Resume previous diet.                         - Continue present medications.                        - Repeat colonoscopy is recommended for surveillance.                         The colonoscopy date will be determined after                         pathology results from today's exam become available                         for review.                        - Return to GI office PRN.                        - The findings and recommendations were discussed with                         the patient. Procedure Code(s):     --- Professional ---                        [phone], Colonoscopy, flexible; with biopsy, single or                         multiple Diagnosis Code(s):     --- Professional ---                        K57.30, Diverticulosis of large intestine without                         perforation or abscess without bleeding                        D12.8, Benign neoplasm of rectum                        K64.0, First degree hemorrhoids                        Z12.11, Encounter  for screening for malignant neoplasm                         of colon CPT copyright 2022 American [employer]. All rights reserved. The codes documented in this report are preliminary and upon coder review may  be revised to meet current compliance requirements. Ladell MARLA Boss MD, MD 03/27/2024 9:54:56 AM This report has been signed electronically. Number of Addenda: 0 Note Initiated On: 03/27/2024 9:26 AM Scope Withdrawal Time: 0 hours 6 minutes 36 seconds  Total Procedure Duration: 0 hours 12 minutes 28 seconds  Estimated Blood Loss:  Estimated blood loss was minimal.      [facility]

## 2024-03-27 NOTE — Anesthesia Postprocedure Evaluation (Signed)
"   Anesthesia Post Note  Patient: Paige Bell  Procedure(s) Performed: COLONOSCOPY POLYPECTOMY, INTESTINE  Patient location during evaluation: PACU Anesthesia Type: General Level of consciousness: awake and alert Pain management: pain level controlled Vital Signs Assessment: post-procedure vital signs reviewed and stable Respiratory status: spontaneous breathing, nonlabored ventilation and respiratory function stable Cardiovascular status: blood pressure returned to baseline and stable Postop Assessment: no apparent nausea or vomiting Anesthetic complications: no   There were no known notable events for this encounter.   Last Vitals:  Vitals:   03/27/24 1002 03/27/24 1012  BP: 111/78 135/78  Pulse: 65   Resp: 13 19  Temp:    SpO2: 97% 98%    Last Pain:  Vitals:   03/27/24 1002  TempSrc:   PainSc: 0-No pain                 Camellia Merilee Louder      "

## 2024-03-27 NOTE — H&P (Signed)
 Outpatient short stay form Pre-procedure 03/27/2024 9:27 AM Bell Bell, M.D.  Primary Physician: Bell Bell, M.D.  Reason for visit:  Colon cancer screening  History of present illness:  Patient presents for colonoscopy for colon cancer screening. The patient denies complaints of abdominal pain, significant change in bowel habits, or rectal bleeding.  Cologuard testing 3 years ago was reportedly negative.   Current Medications[1]  Medications Prior to Admission  Medication Sig Dispense Refill Last Dose/Taking   acetaminophen  (TYLENOL ) 500 MG tablet Take 2 tablets (1,000 mg total) by mouth every 6 (six) hours as needed for moderate pain or fever. 30 tablet 0    albuterol  (PROVENTIL  HFA;VENTOLIN  HFA) 108 (90 BASE) MCG/ACT inhaler Inhale 1-2 puffs into the lungs every 4 (four) hours as needed for wheezing or shortness of breath. 1 Inhaler 0    amoxicillin -clavulanate (AUGMENTIN ) 875-125 MG per tablet Take 1 tablet by mouth every 12 (twelve) hours. 20 tablet 0    baclofen (LIORESAL) 10 MG tablet Take 10 mg by mouth 3 (three) times daily.      fluticasone  (FLONASE ) 50 MCG/ACT nasal spray Place 1 spray into both nostrils 2 (two) times daily. 16 g 0    predniSONE  (DELTASONE ) 50 MG tablet Take one tab po daily 5 tablet 0    pseudoephedrine  (SUDAFED) 30 MG tablet Take 1 tablet (30 mg total) by mouth every 4 (four) hours as needed for congestion. Max 8 tabs per 24 hours 30 tablet 0    sodium chloride (OCEAN) 0.65 % SOLN nasal spray Place 2 sprays into both nostrils every 2 (two) hours while awake.  0      Allergies[2]   Past Medical History:  Diagnosis Date   Back pain     Review of systems:  Otherwise negative.    Physical Exam  Gen: Alert, oriented. Appears stated age.  HEENT: [place]/AT. PERRLA. Lungs: CTA, no wheezes. CV: RR nl S1, S2. Abd: soft, benign, no masses. BS+ Ext: No edema. Pulses 2+    Planned procedures: Proceed with colonoscopy. The patient understands the  nature of the planned procedure, indications, risks, alternatives and potential complications including but not limited to bleeding, infection, perforation, damage to internal organs and possible oversedation/side effects from anesthesia. The patient agrees and gives consent to proceed.  Please refer to procedure notes for findings, recommendations and patient disposition/instructions.     Bell Bell, M.D. Gastroenterology 03/27/2024  9:27 AM          [1]  Current Facility-Administered Medications:    0.9 %  sodium chloride infusion, , Intravenous, Continuous, Bell Bell POUR, MD, Last Rate: 20 mL/hr at 03/27/24 0835, New Bag at 03/27/24 0835 [2]  Allergies Allergen Reactions   Cashew Nut Oil Rash

## 2024-03-27 NOTE — Transfer of Care (Signed)
 Immediate Anesthesia Transfer of Care Note  Patient: Paige Bell  Procedure(s) Performed: COLONOSCOPY POLYPECTOMY, INTESTINE  Patient Location: PACU  Anesthesia Type:General  Level of Consciousness: drowsy  Airway & Oxygen Therapy: Patient Spontanous Breathing  Post-op Assessment: Report given to RN and Post -op Vital signs reviewed and stable  Post vital signs: Reviewed and stable  Last Vitals:  Vitals Value Taken Time  BP    Temp    Pulse    Resp    SpO2      Last Pain:  Vitals:   03/27/24 0821  TempSrc: Tympanic  PainSc: 0-No pain         Complications: There were no known notable events for this encounter.

## 2024-03-27 NOTE — Interval H&P Note (Signed)
 History and Physical Interval Note:  03/27/2024 9:28 AM  Paige Bell  has presented today for surgery, with the diagnosis of Colon cancer screening.  The various methods of treatment have been discussed with the patient and family. After consideration of risks, benefits and other options for treatment, the patient has consented to  Procedures: COLONOSCOPY (N/A) as a surgical intervention.  The patient's history has been reviewed, patient examined, no change in status, stable for surgery.  I have reviewed the patient's chart and labs.  Questions were answered to the patient's satisfaction.     [place], [place]

## 2024-03-28 LAB — SURGICAL PATHOLOGY
